# Patient Record
Sex: Female | Born: 1935 | Race: Black or African American | Hispanic: No | Smoking: Never smoker
Health system: Southern US, Community
[De-identification: ages and names within clinical notes are randomized; demographics above are authoritative.]

## PROBLEM LIST (undated history)

## (undated) DIAGNOSIS — I10 Essential (primary) hypertension: Secondary | ICD-10-CM

---

## 2015-10-16 DIAGNOSIS — M159 Polyosteoarthritis, unspecified: Secondary | ICD-10-CM | POA: Insufficient documentation

## 2015-10-16 DIAGNOSIS — E119 Type 2 diabetes mellitus without complications: Secondary | ICD-10-CM | POA: Insufficient documentation

## 2015-10-16 DIAGNOSIS — R413 Other amnesia: Secondary | ICD-10-CM | POA: Insufficient documentation

## 2015-10-16 DIAGNOSIS — E782 Mixed hyperlipidemia: Secondary | ICD-10-CM | POA: Insufficient documentation

## 2015-10-16 DIAGNOSIS — I10 Essential (primary) hypertension: Secondary | ICD-10-CM | POA: Insufficient documentation

## 2015-10-16 DIAGNOSIS — M8580 Other specified disorders of bone density and structure, unspecified site: Secondary | ICD-10-CM | POA: Insufficient documentation

## 2015-10-16 DIAGNOSIS — H409 Unspecified glaucoma: Secondary | ICD-10-CM | POA: Insufficient documentation

## 2015-10-27 DIAGNOSIS — D72819 Decreased white blood cell count, unspecified: Secondary | ICD-10-CM | POA: Insufficient documentation

## 2016-01-18 DIAGNOSIS — K5730 Diverticulosis of large intestine without perforation or abscess without bleeding: Secondary | ICD-10-CM | POA: Insufficient documentation

## 2016-03-04 ENCOUNTER — Emergency Department (HOSPITAL_BASED_OUTPATIENT_CLINIC_OR_DEPARTMENT_OTHER)
Admission: EM | Admit: 2016-03-04 | Discharge: 2016-03-04 | Disposition: A | Discharge status: Home / Self Care | Payer: Medicare Other | Attending: Emergency Medicine | Admitting: Emergency Medicine

## 2016-03-04 ENCOUNTER — Encounter (HOSPITAL_BASED_OUTPATIENT_CLINIC_OR_DEPARTMENT_OTHER): Payer: Self-pay | Admitting: Emergency Medicine

## 2016-03-04 ENCOUNTER — Emergency Department (HOSPITAL_BASED_OUTPATIENT_CLINIC_OR_DEPARTMENT_OTHER): Payer: Medicare Other

## 2016-03-04 DIAGNOSIS — Y9289 Other specified places as the place of occurrence of the external cause: Secondary | ICD-10-CM | POA: Diagnosis not present

## 2016-03-04 DIAGNOSIS — Z79899 Other long term (current) drug therapy: Secondary | ICD-10-CM | POA: Diagnosis not present

## 2016-03-04 DIAGNOSIS — Y999 Unspecified external cause status: Secondary | ICD-10-CM | POA: Diagnosis not present

## 2016-03-04 DIAGNOSIS — Y9389 Activity, other specified: Secondary | ICD-10-CM | POA: Diagnosis not present

## 2016-03-04 DIAGNOSIS — S8001XA Contusion of right knee, initial encounter: Secondary | ICD-10-CM

## 2016-03-04 DIAGNOSIS — W1789XA Other fall from one level to another, initial encounter: Secondary | ICD-10-CM | POA: Insufficient documentation

## 2016-03-04 DIAGNOSIS — M25461 Effusion, right knee: Secondary | ICD-10-CM

## 2016-03-04 DIAGNOSIS — S8991XA Unspecified injury of right lower leg, initial encounter: Secondary | ICD-10-CM | POA: Diagnosis present

## 2016-03-04 DIAGNOSIS — I10 Essential (primary) hypertension: Secondary | ICD-10-CM | POA: Diagnosis not present

## 2016-03-04 HISTORY — DX: Essential (primary) hypertension: I10

## 2016-03-04 MED ORDER — TRAMADOL HCL 50 MG PO TABS
50.0000 mg | ORAL_TABLET | Freq: Once | ORAL | Status: AC
  Administered 2016-03-04: 50 mg via ORAL
  Filled 2016-03-04: qty 1

## 2016-03-04 MED ORDER — TRAMADOL HCL 50 MG PO TABS: 50.0000 mg | ORAL_TABLET | Freq: Two times a day (BID) | ORAL | Status: AC | PRN

## 2016-03-04 NOTE — ED Notes (Signed)
Family is concern that patient might have a blood clot due to her age, wished this nurse to pass on to physician.

## 2016-03-04 NOTE — ED Notes (Addendum)
Pt alert, NAD, calm, no changes, updated on wait/ process. [facility]

## 2016-03-04 NOTE — Discharge Instructions (Signed)

## 2016-03-04 NOTE — ED Notes (Signed)
Pt was out of town today and feel while getting off a bus and landed on right knee, pt reports immediate pain, she had to ride on bus 3 hours after intial injury, now complaining of pain to right knee associated with edema, unable to bear weight

## 2016-03-04 NOTE — ED Provider Notes (Signed)
CSN: [id number]     Arrival date & time 03/04/16  1746 History  By signing my name below, I, Bethel Born, attest that this documentation has been prepared under the direction and in the presence of Linwood Dibbles, MD. Electronically Signed: Bethel Born, ED Scribe. 03/04/2016. 7:49 PM   Chief Complaint  Patient presents with  . Knee Injury   The history is provided by the patient. No language interpreter was used.   Katrina Bautista is a 82 y.o. female who presents to the Emergency Department complaining of constant, 9/10 in severity, aching, right knee pain and swelling with onset after a fall today. The pt tripped getting off of a bus landing on the right knee on a concrete surface. Her knee pain is exacerbated by bearing weight and flexion. The application of rubbing alcohol and an OTC balm has provided insufficient pain relief at home. Family is concerned for a blood clot given the swelling. Associated symptoms include an abrasion at the right hand. Pt denies head injury and LOC. The pt also complains of ongoing ankle swelling after frequent walking on a recent trip.  Past Medical History  Diagnosis Date  . Hypertension    History reviewed. No pertinent past surgical history. History reviewed. No pertinent family history. Social History  Substance Use Topics  . Smoking status: Never Smoker   . Smokeless tobacco: Current User  . Alcohol Use: No   OB History    No data available     Review of Systems  Musculoskeletal: Positive for arthralgias.  Skin: Positive for wound.  Neurological: Negative for syncope.  All other systems reviewed and are negative.  Allergies  Review of patient's allergies indicates no known allergies.  Home Medications   Prior to Admission medications   Medication Sig Start Date End Date Taking? Authorizing Provider  brimonidine (ALPHAGAN P) 0.1 % SOLN    Yes Historical Provider, MD  calcium acetate (PHOSLO) 667 MG capsule Take by mouth 3 (three)  times daily with meals.   Yes Historical Provider, MD  dorzolamide-timolol (COSOPT) 22.3-6.8 MG/ML ophthalmic solution 1 drop 2 (two) times daily.   Yes Historical Provider, MD  latanoprost (XALATAN) 0.005 % ophthalmic solution 1 drop at bedtime.   Yes Historical Provider, MD  lisinopril-hydrochlorothiazide (PRINZIDE,ZESTORETIC) 20-25 MG tablet Take 1 tablet by mouth daily.   Yes Historical Provider, MD  lovastatin (MEVACOR) 20 MG tablet Take 20 mg by mouth at bedtime.   Yes Historical Provider, MD  potassium chloride (KLOR-CON) 20 MEQ packet Take by mouth 2 (two) times daily.   Yes Historical Provider, MD  traMADol (ULTRAM) 50 MG tablet Take 1 tablet (50 mg total) by mouth every 12 (twelve) hours as needed for moderate pain. 03/04/16   Linwood Dibbles, MD   BP 121/64 mmHg  Pulse 98  Temp(Src) 99.2 F (37.3 C) (Oral)  Resp 18  Ht  (1.676 m)  Wt 149 lb (67.586 kg)  BMI 24.06 kg/m2  SpO2 98% Physical Exam  Constitutional: She appears well-developed and well-nourished. No distress.  HENT:  Head: Normocephalic and atraumatic.  Right Ear: External ear normal.  Left Ear: External ear normal.  Eyes: Conjunctivae are normal. Right eye exhibits no discharge. Left eye exhibits no discharge. No scleral icterus.  Neck: Neck supple. No tracheal deviation present.  Cardiovascular: Normal rate.   Pulmonary/Chest: Effort normal. No stridor. No respiratory distress.  Musculoskeletal: She exhibits edema (mild, BLEs ).       Right knee: She exhibits swelling and  effusion. She exhibits no deformity, no laceration, no erythema, normal alignment and normal patellar mobility. Tenderness found.  Able to hold leg extended off the bed, tenderness primarily along the patella and superior to the patella   Neurological: She is alert. Cranial nerve deficit: no gross deficits.  Skin: Skin is warm and dry. No rash noted.  Psychiatric: She has a normal mood and affect.  Nursing note and vitals reviewed.   ED Course   Procedures (including critical care time) DIAGNOSTIC STUDIES: Oxygen Saturation is 98% on RA,  normal by my interpretation.    COORDINATION OF CARE: 7:46 PM Discussed treatment plan which includes right knee XR with pt at bedside and pt agreed to plan.  Labs Review Labs Reviewed - No data to display  Imaging Review Dg Knee Complete 4 Views Right  03/04/2016  CLINICAL DATA:  Fall today landing on right patella with diffuse knee pain. EXAM: RIGHT KNEE - COMPLETE 4+ VIEW COMPARISON:  None. FINDINGS: There is mild diffuse decreased bone mineralization. There are mild tricompartmental degenerative changes. Suggestion of small joint effusion. No acute fracture or dislocation. IMPRESSION: No acute fracture. Possible small joint effusion. Mild tricompartmental osteoarthritis. Electronically Signed   By: Elberta Fortisaniel  Boyle M.D.   On: 03/04/2016 18:41   I have personally reviewed and evaluated these images as part of my medical decision-making.    MDM   Final diagnoses:  Knee effusion, right  Knee contusion, right, initial encounter    Pt has an effusion on exam.  Suspect it is related to her fall.  No laxity noted on exam.  No fx.  No patella or quadriceps rupture. Will dc home with a knee ace wrap and crutches.  Follow up with sports medicine.  I personally performed the services described in this documentation, which was scribed in my presence.  The recorded information has been reviewed and is accurate.    Linwood DibblesJon Jelesa Mangini, MD 03/04/16 [phone]

## 2016-03-04 NOTE — ED Notes (Signed)
CMS intact before and after. Pt tolerated well. All questions answered.  

## 2016-03-10 ENCOUNTER — Ambulatory Visit (INDEPENDENT_AMBULATORY_CARE_PROVIDER_SITE_OTHER): Payer: Medicare Other | Admitting: Family Medicine

## 2016-03-10 ENCOUNTER — Encounter: Payer: Self-pay | Admitting: Family Medicine

## 2016-03-10 VITALS — BP 115/66 | HR 82 | Ht 70.0 in | Wt 148.0 lb

## 2016-03-10 DIAGNOSIS — S8991XA Unspecified injury of right lower leg, initial encounter: Secondary | ICD-10-CM

## 2016-03-10 NOTE — Patient Instructions (Signed)
You have a knee contusion. I would expect this to take 2-4 more weeks for you to feel completely better. Knee sleeve or ACE wrap for compression. Icing 15 minutes at a time 3-4 times a day for swelling and pain. Ibuprofen, tylenol only if needed for pain. Knee extensions, straight leg raises 3 sets of 10 once a day for 4-6 weeks. Follow up with me in 1 month if needed.

## 2016-03-14 DIAGNOSIS — S8991XA Unspecified injury of right lower leg, initial encounter: Secondary | ICD-10-CM | POA: Insufficient documentation

## 2016-03-14 NOTE — Assessment & Plan Note (Signed)
exam reassuring.  Independently reviewed radiographs and no evidence fracture.  2/2 knee contusion.  Improving since injury date as well.  Compression, icing, tylenol/ibuprofen if needed.  Shown home exercises to do daily.  F/u in 1 month if needed.

## 2016-03-14 NOTE — Progress Notes (Signed)
PCP: No primary care provider on file.  Subjective:   HPI: Patient is a 82 y.o. female here for right knee injury.  Patient reports on 6/17 her knee gave out and she fell directly onto a concrete walkway with knees. Injury to right knee primarily - was severe but has improved since then. Pain level 6/10 at worst, sharp. Walking more slowly than usual. No icing, regular medications now for pain. Worse with walking, better with rest. Some welling. No skin changes, numbness.  Past Medical History  Diagnosis Date  . Hypertension     Current Outpatient Prescriptions on File Prior to Visit  Medication Sig Dispense Refill  . brimonidine (ALPHAGAN P) 0.1 % SOLN     . calcium acetate (PHOSLO) 667 MG capsule Take by mouth 3 (three) times daily with meals.    . dorzolamide-timolol (COSOPT) 22.3-6.8 MG/ML ophthalmic solution 1 drop 2 (two) times daily.    Marland Kitchen. latanoprost (XALATAN) 0.005 % ophthalmic solution 1 drop at bedtime.    Marland Kitchen. lisinopril-hydrochlorothiazide (PRINZIDE,ZESTORETIC) 20-25 MG tablet Take 1 tablet by mouth daily.    Marland Kitchen. lovastatin (MEVACOR) 20 MG tablet Take 20 mg by mouth at bedtime.    . potassium chloride (KLOR-CON) 20 MEQ packet Take by mouth 2 (two) times daily.    . traMADol (ULTRAM) 50 MG tablet Take 1 tablet (50 mg total) by mouth every 12 (twelve) hours as needed for moderate pain. 30 tablet 0   No current facility-administered medications on file prior to visit.    No past surgical history on file.  No Known Allergies  Social History   Social History  . Marital Status: Unknown    Spouse Name: N/A  . Number of Children: N/A  . Years of Education: N/A   Occupational History  . Not on file.   Social History Main Topics  . Smoking status: Never Smoker   . Smokeless tobacco: Current User  . Alcohol Use: No  . Drug Use: Not on file  . Sexual Activity: Not on file   Other Topics Concern  . Not on file   Social History Narrative    No family history on  file.  BP 115/66 mmHg  Pulse 82  Ht 5\' 10"  (1.778 m)  Wt 148 lb (67.132 kg)  BMI 21.24 kg/m2  Review of Systems: See HPI above.    Objective:  Physical Exam:  Gen: NAD, comfortable in exam room  Right knee: No gross deformity, ecchymoses, effusion. Mild TTP medial joint line, lateral joint line.  No other tenderness. FROM. Negative ant/post drawers. Negative valgus/varus testing. Negative lachmanns. Negative mcmurrays, apleys, patellar apprehension. NV intact distally.    Assessment & Plan:  1. Right knee injury - exam reassuring.  Independently reviewed radiographs and no evidence fracture.  2/2 knee contusion.  Improving since injury date as well.  Compression, icing, tylenol/ibuprofen if needed.  Shown home exercises to do daily.  F/u in 1 month if needed.

## 2017-05-05 IMAGING — DX DG KNEE COMPLETE 4+V*R*
4 series · 4 of 4 positions shown · non-contrast
Comparison: None.

CLINICAL DATA: Fall today landing on right patella with diffuse
knee pain.

EXAM:
RIGHT KNEE - COMPLETE 4+ VIEW

[knee ap]
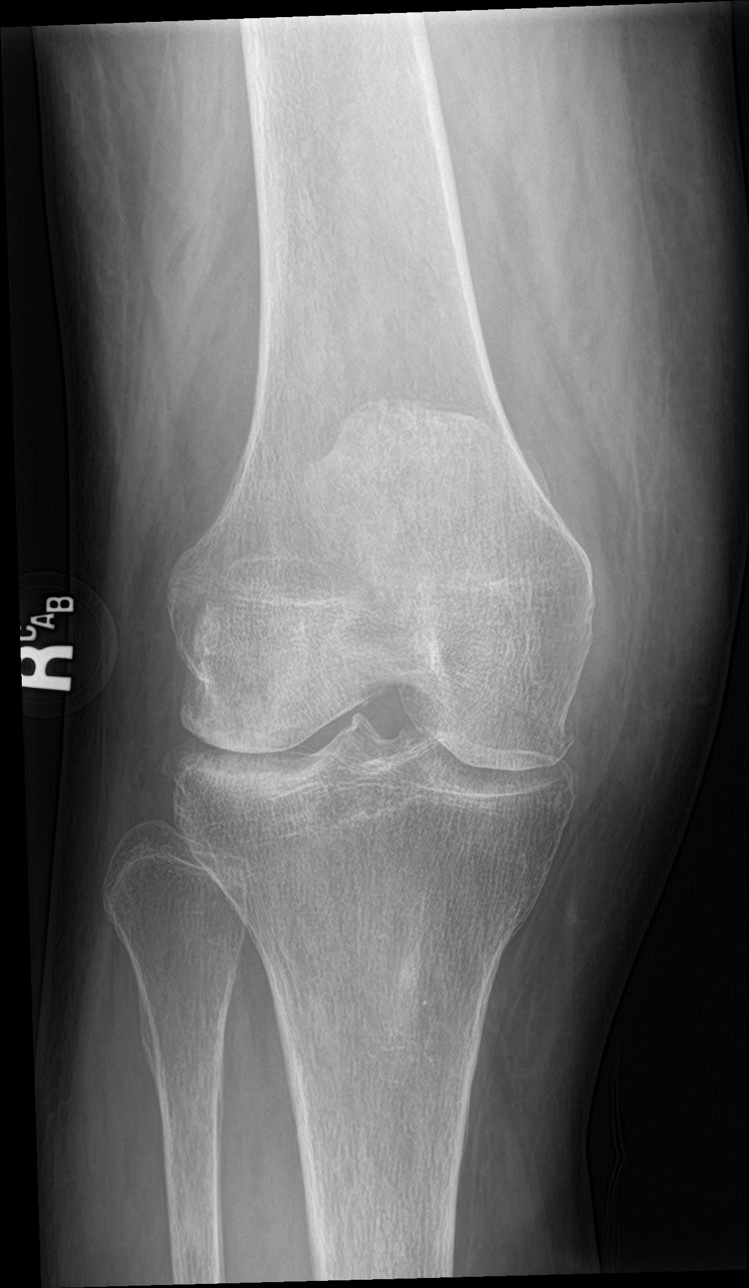

[knee lat]
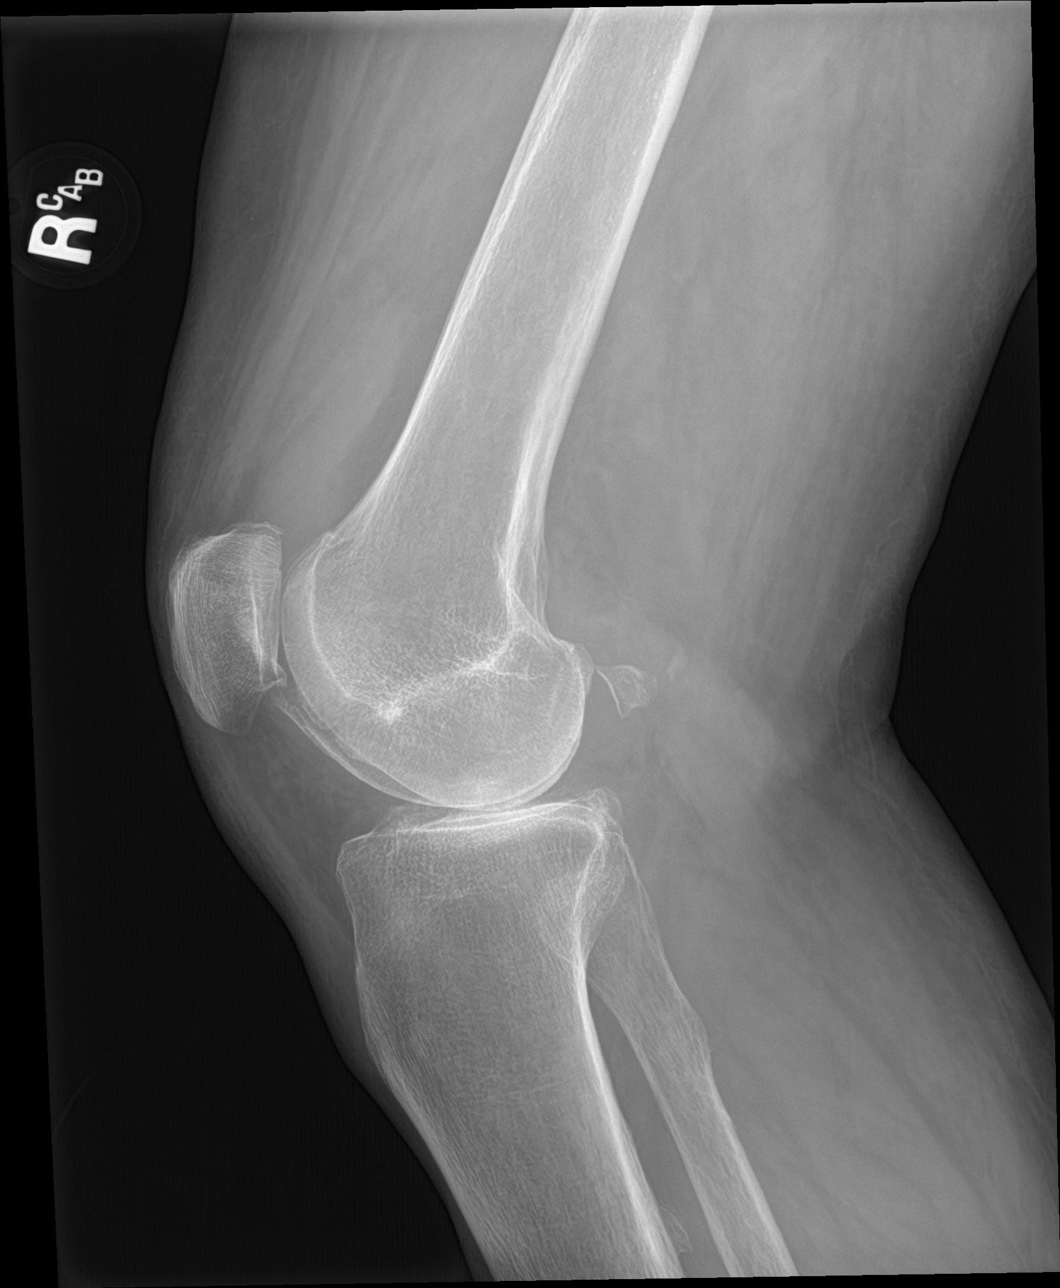

[knee obl (1 of 2)]
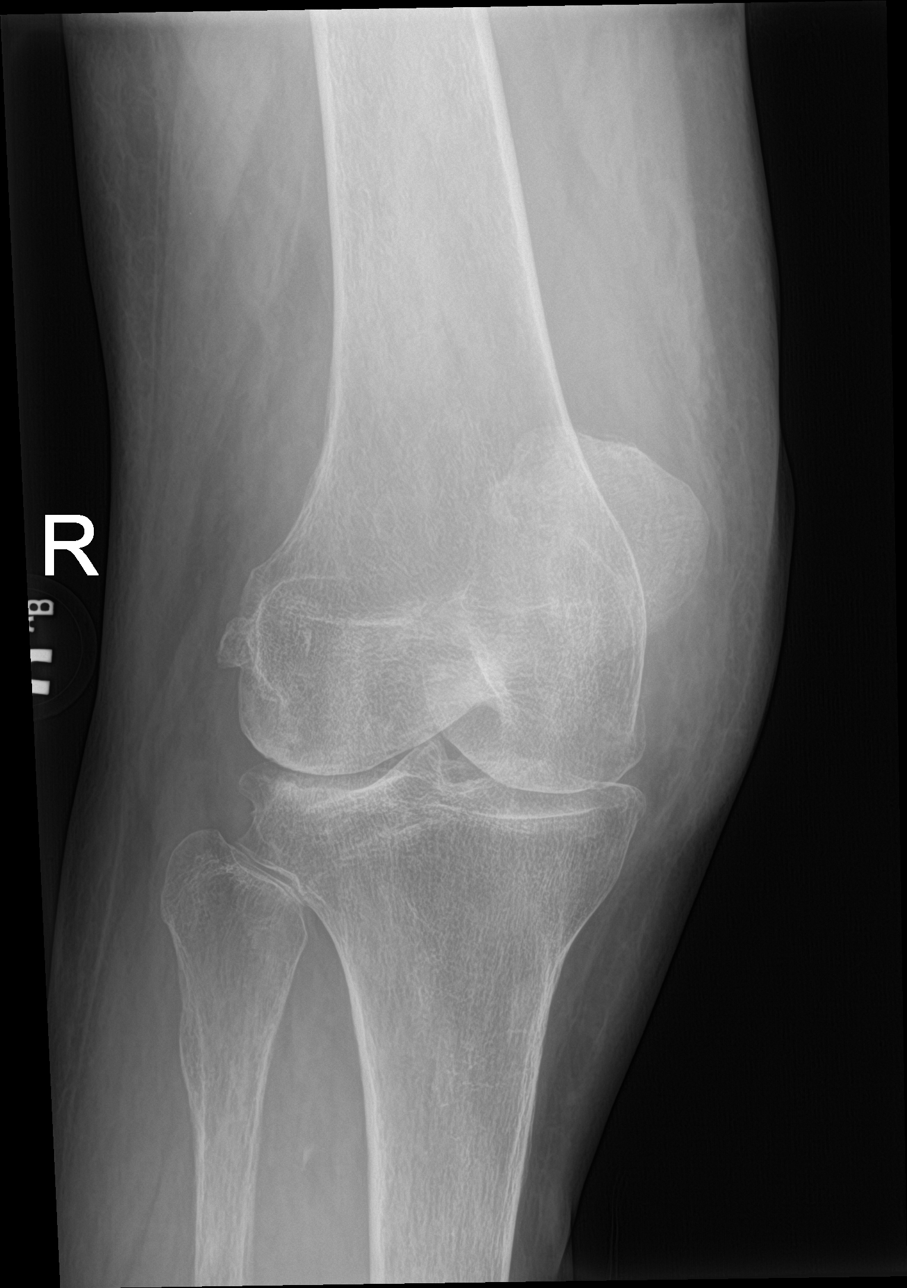

[knee obl (2 of 2)]
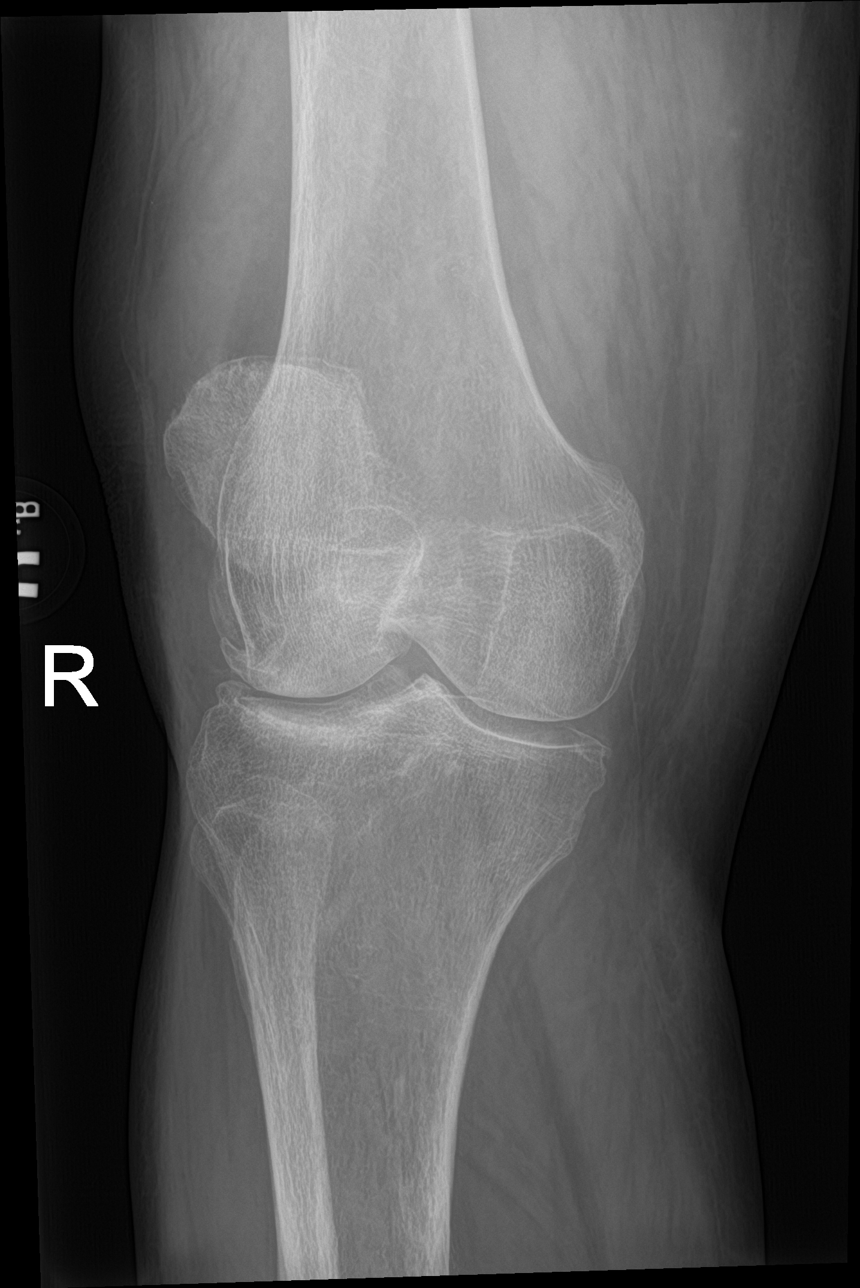

[4 of 4 positions shown; findings below may reference images not displayed]

FINDINGS: There is mild diffuse decreased bone mineralization. There are mild
tricompartmental degenerative changes. Suggestion of small joint
effusion. No acute fracture or dislocation.
IMPRESSION: No acute fracture.

Possible small joint effusion.

Mild tricompartmental osteoarthritis.
